# Patient Record
Sex: Female | Born: 1995 | Hispanic: Yes | Marital: Single | Smoking: Never smoker
Health system: Southern US, Community
[De-identification: ages and names within clinical notes are randomized; demographics above are authoritative.]

---

## 2014-06-12 ENCOUNTER — Other Ambulatory Visit (HOSPITAL_COMMUNITY): Payer: Self-pay | Admitting: Obstetrics and Gynecology

## 2014-06-12 DIAGNOSIS — R772 Abnormality of alphafetoprotein: Secondary | ICD-10-CM

## 2014-06-19 ENCOUNTER — Ambulatory Visit (HOSPITAL_COMMUNITY)
Admission: RE | Admit: 2014-06-19 | Discharge: 2014-06-19 | Disposition: A | Discharge status: Home / Self Care | Payer: Medicaid Other | Source: Ambulatory Visit | Attending: Obstetrics and Gynecology | Admitting: Obstetrics and Gynecology

## 2014-06-19 ENCOUNTER — Encounter (HOSPITAL_COMMUNITY): Payer: Self-pay

## 2014-06-19 DIAGNOSIS — O288 Other abnormal findings on antenatal screening of mother: Secondary | ICD-10-CM | POA: Insufficient documentation

## 2014-06-19 DIAGNOSIS — O285 Abnormal chromosomal and genetic finding on antenatal screening of mother: Secondary | ICD-10-CM

## 2014-06-19 DIAGNOSIS — R772 Abnormality of alphafetoprotein: Secondary | ICD-10-CM | POA: Insufficient documentation

## 2014-06-19 DIAGNOSIS — Z3A17 17 weeks gestation of pregnancy: Secondary | ICD-10-CM | POA: Insufficient documentation

## 2014-06-19 DIAGNOSIS — Z363 Encounter for antenatal screening for malformations: Secondary | ICD-10-CM | POA: Insufficient documentation

## 2014-06-19 NOTE — Progress Notes (Signed)
Genetic Counseling  High-Risk Gestation Note  Appointment Date:  06/19/2014 Referred By: Penny Cannon, Penny E, DO Date of Birth:  1995-09-10  Pregnancy History: [id number] Estimated Date of Delivery: 11/23/14 Estimated Gestational Age: [id number] Attending: Particia Nearingecker, Martha, MD  [name] and her partner were seen for genetic counseling because of an increased risk for fetal Down syndrome based on maternal serum Tetra screening through [facility].  They were counseled regarding the Quad screen result and the associated 1 in 58 risk for fetal Down syndrome.  [name] reported that although she is certain that her LMP was mid August, she has irregular periods.  This considered, Dr. Sherrie Georgeecker determined that the most accurate dating for Tetra screen interpretation is based on [name]'s ultrasound from June 11, 2014 (EDC: 11/23/14).  [facility] was called and the Tetra screen was recalculated using an EDC of 11/23/14.  The adjusted risk for fetal Down syndrome is 1 in 427.  We reviewed that this is a normal result, although increased above her age related risk for fetal Down syndrome (1 in 1167).    We reviewed chromosomes, nondisjunction, and the common features and variable prognosis of Down syndrome.  We also reviewed the adjusted reduction in risks for trisomy 18 and ONTDs.  They understand that this screening is not diagnostic for Down syndrome but provides a risk assessment.  We reviewed available screening options including noninvasive prenatal screening (NIPS)/cell free fetal DNA (cffDNA) testing, and detailed ultrasound.  They were counseled that screening tests are used to modify a patient's a priori risk for aneuploidy, typically based on age. This estimate provides a pregnancy specific risk assessment. We reviewed the benefits and limitations of each option. Specifically, we discussed the conditions for which each test screens, the detection rates, and false positive rates of each. They  were also counseled regarding diagnostic testing via amniocentesis. We reviewed the approximate 1 in 300-500 risk for complications for amniocentesis, including spontaneous pregnancy loss.   After consideration of all the options, they elected to have a detailed ultrasound.  A complete ultrasound was performed today. The ultrasound report will be documented separately.  There were no visualized fetal anomalies or markers suggestive of aneuploidy. Amniocentesis and NIPS ([facility]) were declined today.  They understand that screening tests cannot rule out all birth defects or genetic syndromes. The patient was advised of this limitation and states she still does not want additional testing at this time.   [name] was provided with written information regarding sickle cell anemia (SCA) including the carrier frequency and incidence in the Hispanic population, the availability of carrier testing and prenatal diagnosis if indicated.  In addition, we discussed that hemoglobinopathies are routinely screened for as part of the [facility] newborn screening panel.  She declined hemoglobin electrophoresis today.   Both family histories were reviewed and found to be noncontributory for birth defects, intellectual disability, and known genetic conditions.  Without further information regarding the provided family history, an accurate genetic risk cannot be calculated. Further genetic counseling is warranted if more information is obtained.  [name] denied exposure to environmental toxins or chemical agents. She denied the use of alcohol, tobacco or street drugs. She denied significant viral illnesses during the course of her pregnancy. Her medical and surgical histories were noncontributory.   I counseled this couple for approximately 35 minutes regarding the above risks and available options.   Penny Prosehristy S. Wilbern Pennypacker, MS Certified Genetic Counselor

## 2014-06-26 ENCOUNTER — Other Ambulatory Visit (HOSPITAL_COMMUNITY): Payer: Self-pay

## 2015-04-24 ENCOUNTER — Encounter (HOSPITAL_COMMUNITY): Payer: Self-pay | Admitting: *Deleted

## 2021-05-28 ENCOUNTER — Encounter: Payer: Self-pay | Admitting: Radiology

## 2021-07-02 ENCOUNTER — Ambulatory Visit (INDEPENDENT_AMBULATORY_CARE_PROVIDER_SITE_OTHER): Payer: 59 | Admitting: Obstetrics and Gynecology

## 2021-07-02 ENCOUNTER — Encounter: Payer: Self-pay | Admitting: Obstetrics and Gynecology

## 2021-07-02 ENCOUNTER — Other Ambulatory Visit: Payer: Self-pay

## 2021-07-02 VITALS — BP 125/83 | HR 94 | Ht 59.0 in | Wt 165.0 lb

## 2021-07-02 DIAGNOSIS — T8332XA Displacement of intrauterine contraceptive device, initial encounter: Secondary | ICD-10-CM

## 2021-07-02 DIAGNOSIS — Z30432 Encounter for removal of intrauterine contraceptive device: Secondary | ICD-10-CM

## 2021-07-02 MED ORDER — THERA VITAL M PO TABS: 1.0000 | ORAL_TABLET | Freq: Every day | ORAL | Status: AC

## 2021-07-02 NOTE — Progress Notes (Signed)
NGYN presents for IUD removal. Then would like to discuss Family planning.  Last pap :03/2021 WNL No cycles with IUD.

## 2021-07-02 NOTE — Procedures (Signed)
Intrauterine Device (IUD) Removal Procedure Note  Patient has had Mirena in since birth of last child and desires to conceive again. LMP: none. Pap smear normal last year, per patient. Only h/o pyelo with PNA in last pregnancy. She is on no medications currently  Prior to the procedure being performed, the patient was asked to state their full name, date of birth, and the type of procedure being performed. EGBUS normal. Vaginal vault normal. Cervix normal with IUD strings not seen. Betadine applied to cervix and grasped on the anterior lip with a tenaculum. IUD hook used to easily tease strings down, and strings then grasped with ringed forceps and easily removed and noted to be intact.   No complications, patient tolerated the procedure well. I told her I recommend she start a multivitamin/folic acid  Cornelia Copa MD Attending [facility] [employer] (Faculty Practice) 07/02/2021
